# Patient Record
Sex: Female | Born: 2005 | Race: White | Hispanic: No | Marital: Single | Smoking: Never smoker
Health system: Southern US, Community
[De-identification: ages and names within clinical notes are randomized; demographics above are authoritative.]

## PROBLEM LIST (undated history)

## (undated) DIAGNOSIS — F419 Anxiety disorder, unspecified: Secondary | ICD-10-CM

## (undated) DIAGNOSIS — M1990 Unspecified osteoarthritis, unspecified site: Secondary | ICD-10-CM

## (undated) DIAGNOSIS — J45909 Unspecified asthma, uncomplicated: Secondary | ICD-10-CM

## (undated) DIAGNOSIS — G259 Extrapyramidal and movement disorder, unspecified: Secondary | ICD-10-CM

## (undated) DIAGNOSIS — R51 Headache: Secondary | ICD-10-CM

## (undated) DIAGNOSIS — R519 Headache, unspecified: Secondary | ICD-10-CM

## (undated) DIAGNOSIS — Q211 Atrial septal defect: Secondary | ICD-10-CM

## (undated) HISTORY — DX: Anxiety disorder, unspecified: F41.9

## (undated) HISTORY — DX: Atrial septal defect: Q21.1

## (undated) HISTORY — PX: TYMPANOSTOMY TUBE PLACEMENT: SHX32

## (undated) HISTORY — DX: Headache: R51

## (undated) HISTORY — DX: Unspecified asthma, uncomplicated: J45.909

## (undated) HISTORY — DX: Extrapyramidal and movement disorder, unspecified: G25.9

## (undated) HISTORY — PX: CARDIAC SURGERY: SHX584

## (undated) HISTORY — DX: Unspecified osteoarthritis, unspecified site: M19.90

## (undated) HISTORY — PX: CLEFT PALATE REPAIR: SUR1165

## (undated) HISTORY — DX: Headache, unspecified: R51.9

---
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
## 2023-01-06 ENCOUNTER — Other Ambulatory Visit (INDEPENDENT_AMBULATORY_CARE_PROVIDER_SITE_OTHER): Payer: Self-pay

## 2023-01-06 DIAGNOSIS — R259 Unspecified abnormal involuntary movements: Secondary | ICD-10-CM

## 2023-01-06 MED ORDER — TRIHEXYPHENIDYL HCL 2 MG PO TABS: ORAL_TABLET | ORAL | 1 refills | Status: DC

## 2023-01-06 MED ORDER — CARBIDOPA-LEVODOPA 25-100 MG PO TABS: ORAL_TABLET | ORAL | 1 refills | Status: DC

## 2023-01-06 NOTE — Telephone Encounter (Signed)
Last OV: 03-20-2022  Last Rx(s): 03-20-2022  Next appointment: None scheduled. Last appointment (Cancelled).  B. Roten CMA

## 2023-02-23 ENCOUNTER — Other Ambulatory Visit (INDEPENDENT_AMBULATORY_CARE_PROVIDER_SITE_OTHER): Payer: Self-pay | Admitting: Neurology

## 2023-02-24 NOTE — Telephone Encounter (Signed)
Last OV: 03-20-2022  Next Appointment: Not scheduled, Last was No Show.  Last refill: 08-30-2022  Note: On previous/most recent visit, plan incuded to possible d/c this medication.  B. Roten CMA

## 2023-03-03 ENCOUNTER — Other Ambulatory Visit (INDEPENDENT_AMBULATORY_CARE_PROVIDER_SITE_OTHER): Payer: Self-pay | Admitting: Neurology

## 2023-03-03 DIAGNOSIS — R259 Unspecified abnormal involuntary movements: Secondary | ICD-10-CM

## 2023-03-03 NOTE — Telephone Encounter (Signed)
Last OV 03/20/22 10/21/22 OV cx Sinemet last rx 01/06/23 with 1 rf  Artane last rx 01/06/23 with 1 rf Message to front to contact family to sched appt.  Refilled x 1 month with no further refills until after appt

## 2023-03-18 DIAGNOSIS — N921 Excessive and frequent menstruation with irregular cycle: Secondary | ICD-10-CM | POA: Insufficient documentation

## 2023-03-31 ENCOUNTER — Other Ambulatory Visit (INDEPENDENT_AMBULATORY_CARE_PROVIDER_SITE_OTHER): Payer: Self-pay | Admitting: Neurology

## 2023-03-31 DIAGNOSIS — R259 Unspecified abnormal involuntary movements: Secondary | ICD-10-CM

## 2023-04-01 NOTE — Telephone Encounter (Signed)
Last OV: 03-20-2022  Next OV: *Upcoming (04-09-2023)  Last Rx(s):  Artane: 03-03-2023 Sinemet: 03-03-2023 Flexeril: 02-25-2023  B. Roten CMA

## 2023-04-09 ENCOUNTER — Ambulatory Visit (INDEPENDENT_AMBULATORY_CARE_PROVIDER_SITE_OTHER): Payer: Self-pay | Admitting: Neurology

## 2023-04-09 NOTE — Progress Notes (Deleted)
Patient: Shelby Page MRN: [id number] Sex: female DOB: 2006-10-05  Provider: Keturah Shavers, MD Location of Care: [facility]  Note type: {CN NOTE [id number]}  Referral Source: Sherrie Mustache., MD   History from: {CN REFERRED AO:[id number]} Chief Complaint: Follow up Migraines, Dystonic Movements, ADHD  History of Present Illness:  Shelby Page is a 17 y.o. female ***.  Review of Systems: Review of system as per HPI, otherwise negative.  Past Medical History:  Diagnosis Date   ASD (atrial septal defect) 2010   Asthma    Headache    Movement disorder    Hospitalizations: {yes no:314532}, Head Injury: {yes no:314532}, Nervous System Infections: {yes no:314532}, Immunizations up to date: {yes no:314532}  Birth History ***  Surgical History Past Surgical History:  Procedure Laterality Date   CARDIAC SURGERY  at age 5, 61   CLEFT PALATE REPAIR  at age 55   TYMPANOSTOMY TUBE PLACEMENT      Family History family history includes ADD / ADHD in her sister; Allergic rhinitis in her sister; Asthma in her father and sister; Seizures in her sister. Family History is negative for ***.  Social History Social History   Socioeconomic History   Marital status: Single    Spouse name: Not on file   Number of children: Not on file   Years of education: Not on file   Highest education level: Not on file  Occupational History   Not on file  Tobacco Use   Smoking status: Never    Passive exposure: Never   Smokeless tobacco: Never  Vaping Use   Vaping Use: Never used  Substance and Sexual Activity   Alcohol use: No    Alcohol/week: 0.0 standard drinks of alcohol   Drug use: No   Sexual activity: Never  Other Topics Concern   Not on file  Social History Narrative   Shelby Page is a 8th grader on line classes. She does well in school.   Lives with her mom and sister.   Social Determinants of Health   Financial Resource Strain: Not on file  Food  Insecurity: Not on file  Transportation Needs: Not on file  Physical Activity: Not on file  Stress: Not on file  Social Connections: Not on file     Allergies  Allergen Reactions   Molds & Smuts Other (See Comments)    unknown   Methylpyrrolidone Nausea Only   Milk (Cow) Nausea And Vomiting   Omnicef [Cefdinir] Other (See Comments) and Nausea Only    unkwown    Physical Exam There were no vitals taken for this visit. ***  Assessment and Plan ***  No orders of the defined types were placed in this encounter.  No orders of the defined types were placed in this encounter.

## 2023-04-20 ENCOUNTER — Other Ambulatory Visit (INDEPENDENT_AMBULATORY_CARE_PROVIDER_SITE_OTHER): Payer: Self-pay | Admitting: Neurology

## 2023-04-29 ENCOUNTER — Other Ambulatory Visit (INDEPENDENT_AMBULATORY_CARE_PROVIDER_SITE_OTHER): Payer: Self-pay | Admitting: Neurology

## 2023-04-29 DIAGNOSIS — R259 Unspecified abnormal involuntary movements: Secondary | ICD-10-CM

## 2023-04-30 ENCOUNTER — Encounter (INDEPENDENT_AMBULATORY_CARE_PROVIDER_SITE_OTHER): Payer: Self-pay

## 2023-05-26 ENCOUNTER — Encounter: Payer: Self-pay | Admitting: Pediatrics

## 2023-06-24 ENCOUNTER — Other Ambulatory Visit (INDEPENDENT_AMBULATORY_CARE_PROVIDER_SITE_OTHER): Payer: Self-pay | Admitting: Neurology

## 2023-06-24 DIAGNOSIS — R259 Unspecified abnormal involuntary movements: Secondary | ICD-10-CM

## 2023-06-24 NOTE — Telephone Encounter (Signed)
Last OV 03/20/2022 Cx 10/21/22 and 04/09/23 no further appt sched. Medications refused until appt is scheduled.  Both Artane and Sinemet were ordered 04/29/23  with 1 refill and both were dispensed 05/27/23 Requested front office call to sched appt

## 2023-06-26 ENCOUNTER — Other Ambulatory Visit (INDEPENDENT_AMBULATORY_CARE_PROVIDER_SITE_OTHER): Payer: Self-pay | Admitting: Neurology

## 2023-07-14 ENCOUNTER — Other Ambulatory Visit (INDEPENDENT_AMBULATORY_CARE_PROVIDER_SITE_OTHER): Payer: Self-pay | Admitting: Neurology

## 2023-07-14 DIAGNOSIS — R259 Unspecified abnormal involuntary movements: Secondary | ICD-10-CM

## 2023-07-14 NOTE — Telephone Encounter (Signed)
  Name of who is calling: [name]    Caller's Relationship to Patient: mom  Best contact number: [phone]  Provider they see: Nab   Reason for call: Rx refills - pt will be out of medications on 9/13 per mom     PRESCRIPTION REFILL ONLY  Name of prescription: Gabapentin 100mg , Flexeril 5mg , Artane 2mg , Sinemet IR 25-100mg   Pharmacy: [facility] - [address] [place] - [phone]

## 2023-07-15 MED ORDER — CARBIDOPA-LEVODOPA 25-100 MG PO TABS
ORAL_TABLET | ORAL | 0 refills | Status: DC
Start: 2023-07-15 — End: 2023-09-25

## 2023-07-15 MED ORDER — CYCLOBENZAPRINE HCL 5 MG PO TABS: ORAL_TABLET | ORAL | 1 refills | Status: DC

## 2023-07-15 MED ORDER — TRIHEXYPHENIDYL HCL 2 MG PO TABS
ORAL_TABLET | ORAL | 1 refills | Status: DC
Start: 2023-07-15 — End: 2023-09-25

## 2023-07-15 MED ORDER — GABAPENTIN 100 MG PO CAPS: ORAL_CAPSULE | ORAL | 1 refills | Status: DC

## 2023-07-15 NOTE — Telephone Encounter (Signed)
  Last OV 03/20/2022 Cx 10/21/22, 04/09/2023  Next visit 09/25/2023  Requesting Flexeril 04/01/2023  0 RF Gabapentin 01/01/2023 6 rf Artane 04/29/23 1 refill Sinemet 04/29/2023 1 refill

## 2023-07-15 NOTE — Telephone Encounter (Signed)
Advised if appt is not kept in Nov she will not receive any further refills from Dr. Devonne Doughty. States understanding

## 2023-08-10 ENCOUNTER — Other Ambulatory Visit (INDEPENDENT_AMBULATORY_CARE_PROVIDER_SITE_OTHER): Payer: Self-pay | Admitting: Neurology

## 2023-08-10 DIAGNOSIS — R259 Unspecified abnormal involuntary movements: Secondary | ICD-10-CM

## 2023-09-06 ENCOUNTER — Other Ambulatory Visit (INDEPENDENT_AMBULATORY_CARE_PROVIDER_SITE_OTHER): Payer: Self-pay | Admitting: Neurology

## 2023-09-06 DIAGNOSIS — R259 Unspecified abnormal involuntary movements: Secondary | ICD-10-CM

## 2023-09-09 ENCOUNTER — Other Ambulatory Visit (INDEPENDENT_AMBULATORY_CARE_PROVIDER_SITE_OTHER): Payer: Self-pay | Admitting: Neurology

## 2023-09-09 DIAGNOSIS — R259 Unspecified abnormal involuntary movements: Secondary | ICD-10-CM

## 2023-09-09 NOTE — Telephone Encounter (Signed)
Last OV 03/20/2022 Next OV 09/25/2023 Artane 07/15/2023 with 1 refill Gabapentin 07/15/2023 with 1 rf

## 2023-09-25 ENCOUNTER — Ambulatory Visit (INDEPENDENT_AMBULATORY_CARE_PROVIDER_SITE_OTHER): Payer: MEDICAID | Admitting: Neurology

## 2023-09-25 ENCOUNTER — Encounter (INDEPENDENT_AMBULATORY_CARE_PROVIDER_SITE_OTHER): Payer: Self-pay | Admitting: Neurology

## 2023-09-25 ENCOUNTER — Telehealth (INDEPENDENT_AMBULATORY_CARE_PROVIDER_SITE_OTHER): Payer: Self-pay | Admitting: Neurology

## 2023-09-25 ENCOUNTER — Telehealth (INDEPENDENT_AMBULATORY_CARE_PROVIDER_SITE_OTHER): Payer: Self-pay

## 2023-09-25 VITALS — BP 112/66 | HR 64 | Ht <= 58 in | Wt 87.3 lb

## 2023-09-25 DIAGNOSIS — G249 Dystonia, unspecified: Secondary | ICD-10-CM | POA: Diagnosis not present

## 2023-09-25 DIAGNOSIS — G43009 Migraine without aura, not intractable, without status migrainosus: Secondary | ICD-10-CM | POA: Diagnosis not present

## 2023-09-25 DIAGNOSIS — R259 Unspecified abnormal involuntary movements: Secondary | ICD-10-CM

## 2023-09-25 DIAGNOSIS — F411 Generalized anxiety disorder: Secondary | ICD-10-CM

## 2023-09-25 DIAGNOSIS — F902 Attention-deficit hyperactivity disorder, combined type: Secondary | ICD-10-CM

## 2023-09-25 DIAGNOSIS — M79605 Pain in left leg: Secondary | ICD-10-CM

## 2023-09-25 DIAGNOSIS — M79604 Pain in right leg: Secondary | ICD-10-CM

## 2023-09-25 MED ORDER — TRIHEXYPHENIDYL HCL 2 MG PO TABS
ORAL_TABLET | ORAL | 1 refills | Status: DC
Start: 2023-09-25 — End: 2023-11-19

## 2023-09-25 MED ORDER — ONDANSETRON 4 MG PO TBDP: ORAL_TABLET | ORAL | 5 refills | Status: AC

## 2023-09-25 MED ORDER — CARBIDOPA-LEVODOPA 25-100 MG PO TABS
ORAL_TABLET | ORAL | 7 refills | Status: DC
Start: 2023-09-25 — End: 2023-12-01

## 2023-09-25 MED ORDER — GABAPENTIN 100 MG PO CAPS
ORAL_CAPSULE | ORAL | 1 refills | Status: DC
Start: 2023-09-25 — End: 2023-11-19

## 2023-09-25 MED ORDER — CLONAZEPAM 0.25 MG PO TBDP: ORAL_TABLET | ORAL | 3 refills | Status: DC

## 2023-09-25 NOTE — Telephone Encounter (Signed)
Cover My Meds [place] Records

## 2023-09-25 NOTE — Progress Notes (Signed)
Patient: Shelby Page MRN: [id number] Sex: female DOB: Mar 17, 2006  Provider: Keturah Shavers, MD Location of Care: [facility]  Note type: Routine return visit  Referral Source: PCP History from: patient, [facility] chart, and MOM Chief Complaint: Migraine without aura and without status migrainosus, not intractable   History of Present Illness: Shelby Page is a 17 y.o. female is here for follow-up management of headache, leg pain and muscle spasms. 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 She has been seen over the past few years and tried to adjust and decrease the dose of medication as much as possible but she was having more frequent symptoms particularly dystonic movements of the right hand and overall shaking episodes that would happen off and on so we had to continue some of these medications with low-dose to control these symptoms. Since her last visit in May 2023, she has been doing fairly well and has been taking her medications regularly but at some point she was out of Artane and she was having more frequent shaking of extremities. She usually sleeps well without any difficulty and with no awakening when she is taking her medication regularly and currently she is not having any significant pain as she had in the past including headache and body pain, leg pain and back pain.   Review of Systems: Review of system as per HPI, otherwise negative.  Past Medical History:  Diagnosis Date   ASD (atrial septal defect) 2010   Asthma    Headache    Movement disorder    Hospitalizations: No., Head Injury: No., Nervous System Infections: No., Immunizations up to date: Yes.     Surgical History Past Surgical  History:  Procedure Laterality Date   CARDIAC SURGERY  at age 52, 46   CLEFT PALATE REPAIR  at age 30   TYMPANOSTOMY TUBE PLACEMENT      Family History family history includes ADD / ADHD in her sister; Allergic rhinitis in her sister; Asthma in her father and sister; Seizures in her sister.   Social History Social History   Socioeconomic History   Marital status: Single    Spouse name: Not on file   Number of children: Not on file   Years of education: Not on file   Highest education level: Not on file  Occupational History   Not on file  Tobacco Use   Smoking status: Never    Passive exposure: Never   Smokeless tobacco: Never  Vaping Use   Vaping status: Never Used  Substance and Sexual Activity   Alcohol use: No    Alcohol/week: 0.0 standard drinks of alcohol   Drug use: No   Sexual activity: Never  Other Topics Concern   Not on file  Social History Narrative      Lives with her mom and sister.   Social Determinants of Health   Financial Resource Strain: Low Risk  (12/13/2022)   Received from [facility], [facility]   Overall Financial Resource Strain (CARDIA)    Difficulty of Paying Living Expenses: Not very hard  Food Insecurity: No Food Insecurity (12/13/2022)   Received from [facility], [facility]   Hunger Vital Sign    Worried About Running Out of Food in the Last Year: Never true    Ran Out  of Food in the Last Year: Never true  Transportation Needs: No Transportation Needs (12/13/2022)   Received from [facility], [facility]   [facility] - Transportation    Lack of Transportation (Medical): No    Lack of Transportation (Non-Medical): No  Physical Activity: Not on file  Stress: Not on file  Social Connections: Unknown (03/12/2022)   Received from [facility], [facility]   Social Network    Social Network: Not on file     Allergies  Allergen Reactions   Molds & Smuts Other (See Comments)    unknown   Methylpyrrolidone Nausea Only    Milk (Cow) Nausea And Vomiting   Omnicef [Cefdinir] Other (See Comments) and Nausea Only    unkwown    Physical Exam BP 112/66   Pulse 64   Ht 4' 9.64" (1.464 m)   Wt 87 lb 4.8 oz (39.6 kg)   BMI 18.48 kg/m  Gen: Awake, alert, not in distress, Non-toxic appearance. Skin: No neurocutaneous stigmata, no rash HEENT: Normocephalic, no dysmorphic features, no conjunctival injection, nares patent, mucous membranes moist, oropharynx clear. Neck: Supple, no meningismus, no lymphadenopathy,  Resp: Clear to auscultation bilaterally CV: Regular rate, normal S1/S2, no murmurs, no rubs Abd: Bowel sounds present, abdomen soft, non-tender, non-distended.  No hepatosplenomegaly or mass. Ext: Warm and well-perfused. No deformity, no muscle wasting, ROM full.  Neurological Examination: MS- Awake, alert, interactive Cranial Nerves- Pupils equal, round and reactive to light (5 to 3mm); fix and follows with full and smooth EOM; no nystagmus; no ptosis, funduscopy with normal sharp discs, visual field full by looking at the toys on the side, face symmetric with smile.  Hearing intact to bell bilaterally, palate elevation is symmetric, and tongue protrusion is symmetric. Tone- Normal Strength-Seems to have good strength, symmetrically by observation and passive movement. Reflexes-    Biceps Triceps Brachioradialis Patellar Ankle  R 2+ 2+ 2+ 2+ 2+  L 2+ 2+ 2+ 2+ 2+   Plantar responses flexor bilaterally, no clonus noted Sensation- Withdraw at four limbs to stimuli. Coordination- Reached to the object with no dysmetria Gait: Normal walk without any coordination or balance issues.   Assessment and Plan 1. Migraine without aura and without status migrainosus, not intractable   2. Dystonic movements   3. Anxiety state   4. Attention deficit hyperactivity disorder (ADHD), combined type   5. Leg pain, bilateral   6. Abnormal involuntary movements    This is a 17 year old female with multiple medical  issues as mentioned in HPI, on multiple different medications which she needed to continue and every time that we would decrease the dose of medications, she would have more different symptoms and we had to go back up on the medication.  She has no new findings on her neurological examination. Recommend to continue the same dose of medications as before but I would slightly decrease the dose of clonazepam to 0.25 mg in case she needed for anxiety and panic attacks but I will continue other medications at the same dose for now. I discussed with mother again regarding transferring care of patient and her sister both to adult neurology at some point over the next year but for now I will schedule a follow-up appointment in about 7 months in case if they would not be able to transfer the care but at any time if they do then I will send all the notes to the new neurologist to continue care.  Mother understood and agreed with the plan.  Meds ordered this encounter  Medications   trihexyphenidyl (ARTANE) 2 MG tablet    Sig: TAKE 1/2 TABLET BY MOUTH EVERY NIGHT    Dispense:  15 tablet    Refill:  1    No further refills will be given if patient does not keep her Nov appt   ondansetron (ZOFRAN-ODT) 4 MG disintegrating tablet    Sig: DISSOLVE 1 TABLET IN MOUTH AT ONSET OF MIGRAINE. MAY REPEAT IN 8 HOURS    Dispense:  20 tablet    Refill:  5   gabapentin (NEURONTIN) 100 MG capsule    Sig: Take 1 capsule in the morning and 2 capsules at night    Dispense:  90 capsule    Refill:  1    Refills require OV   clonazePAM (KLONOPIN) 0.25 MG disintegrating tablet    Sig: Take 1 tablet daily as needed for anxiety or panic attack    Dispense:  30 tablet    Refill:  3   carbidopa-levodopa (SINEMET IR) 25-100 MG tablet    Sig: TAKE 1/2 TABLET BY MOUTH TWICE DAILY    Dispense:  30 tablet    Refill:  7    72 to last until 11/21 OV   No orders of the defined types were placed in this encounter.

## 2023-09-25 NOTE — Addendum Note (Signed)
Addended by: Vita Barley B on: 09/25/2023 05:19 PM   Modules accepted: Orders

## 2023-09-25 NOTE — Telephone Encounter (Signed)
  Name of who is calling: [name] Verbeek  Caller's Relationship to Patient: Mom  Best contact number: [phone]  Provider they see: Dr. Merri Brunette  Reason for call: Mom said medication [facility]ast[facility] [place] on [address]      PRESCRIPTION REFILL ONLY  Name of prescription:  Pharmacy:

## 2023-09-25 NOTE — Patient Instructions (Signed)
Continue carbidopa-levodopa at half a tablet twice daily Continue Flexeril at 5 mg tablet every day in case of muscle spasms Continue gabapentin 1 capsule in the morning and 2 capsules in the evening Continue Artane at 1 mg every night Continue Zofran as needed for nausea  Get a referral to see adult neurology But in case I will make a follow-up appointment in about 7 months

## 2023-09-26 ENCOUNTER — Other Ambulatory Visit: Payer: Self-pay | Admitting: Neurology

## 2023-09-29 NOTE — Telephone Encounter (Signed)
Requested provider change from ODT to tab which is the preferred form

## 2023-10-07 ENCOUNTER — Other Ambulatory Visit (INDEPENDENT_AMBULATORY_CARE_PROVIDER_SITE_OTHER): Payer: Self-pay | Admitting: Neurology

## 2023-10-07 DIAGNOSIS — R259 Unspecified abnormal involuntary movements: Secondary | ICD-10-CM

## 2023-11-15 ENCOUNTER — Other Ambulatory Visit (INDEPENDENT_AMBULATORY_CARE_PROVIDER_SITE_OTHER): Payer: Self-pay | Admitting: Neurology

## 2023-11-15 DIAGNOSIS — R259 Unspecified abnormal involuntary movements: Secondary | ICD-10-CM

## 2023-11-19 ENCOUNTER — Other Ambulatory Visit (INDEPENDENT_AMBULATORY_CARE_PROVIDER_SITE_OTHER): Payer: Self-pay

## 2023-11-19 ENCOUNTER — Telehealth (INDEPENDENT_AMBULATORY_CARE_PROVIDER_SITE_OTHER): Payer: Self-pay | Admitting: Neurology

## 2023-11-19 DIAGNOSIS — R259 Unspecified abnormal involuntary movements: Secondary | ICD-10-CM

## 2023-11-19 MED ORDER — GABAPENTIN 100 MG PO CAPS: ORAL_CAPSULE | ORAL | 2 refills | Status: DC

## 2023-11-19 MED ORDER — TRIHEXYPHENIDYL HCL 2 MG PO TABS: ORAL_TABLET | ORAL | 2 refills | Status: DC

## 2023-11-19 NOTE — Telephone Encounter (Signed)
 Who's calling (name and relationship to patient) : Shelby Page; mom   Best contact number: [phone]  Provider they see: Dr. Blanchie Bunkers   Reason for call: Mom called stating that the adult neurologist won't be able to fill [facility] Rx until she is seen. Her appt is schedule on March 25. Mom is requesting a call back.    Call ID:      PRESCRIPTION REFILL ONLY  Name of prescription:  Pharmacy:

## 2023-11-19 NOTE — Telephone Encounter (Signed)
 Called mom about message that was left about the medications that Dr. Blanchie Bunkers fills. She was stating that the adult neurologist can't refill medication until they are seen. I told her that was true and I will put in a refill for those medications until the appointment which is in March with the adult neurologist. They will take over care and prescriptions after new patient appointment happens.  I put in refills for the patient . Mom understood message

## 2023-11-30 ENCOUNTER — Other Ambulatory Visit (INDEPENDENT_AMBULATORY_CARE_PROVIDER_SITE_OTHER): Payer: Self-pay | Admitting: Neurology

## 2023-11-30 DIAGNOSIS — R259 Unspecified abnormal involuntary movements: Secondary | ICD-10-CM

## 2023-12-01 NOTE — Telephone Encounter (Signed)
Had previously sent over medication refill to [facility] , it was the wrong pharmacy. Sent medication to [facility] so they can have enough to last till March when they see the Adult neurologist.

## 2023-12-13 ENCOUNTER — Other Ambulatory Visit (INDEPENDENT_AMBULATORY_CARE_PROVIDER_SITE_OTHER): Payer: Self-pay | Admitting: Neurology

## 2023-12-13 DIAGNOSIS — R259 Unspecified abnormal involuntary movements: Secondary | ICD-10-CM

## 2023-12-23 ENCOUNTER — Ambulatory Visit: Payer: MEDICAID | Admitting: Internal Medicine

## 2024-01-10 ENCOUNTER — Other Ambulatory Visit (INDEPENDENT_AMBULATORY_CARE_PROVIDER_SITE_OTHER): Payer: Self-pay | Admitting: Neurology

## 2024-01-10 DIAGNOSIS — R259 Unspecified abnormal involuntary movements: Secondary | ICD-10-CM

## 2024-01-15 ENCOUNTER — Ambulatory Visit: Payer: MEDICAID | Admitting: Internal Medicine

## 2024-01-25 ENCOUNTER — Other Ambulatory Visit (INDEPENDENT_AMBULATORY_CARE_PROVIDER_SITE_OTHER): Payer: Self-pay | Admitting: Neurology

## 2024-01-25 DIAGNOSIS — R259 Unspecified abnormal involuntary movements: Secondary | ICD-10-CM

## 2024-02-08 ENCOUNTER — Other Ambulatory Visit (INDEPENDENT_AMBULATORY_CARE_PROVIDER_SITE_OTHER): Payer: Self-pay | Admitting: Neurology

## 2024-02-08 DIAGNOSIS — R259 Unspecified abnormal involuntary movements: Secondary | ICD-10-CM

## 2024-02-11 ENCOUNTER — Ambulatory Visit: Payer: MEDICAID | Admitting: Internal Medicine

## 2024-02-22 ENCOUNTER — Other Ambulatory Visit (INDEPENDENT_AMBULATORY_CARE_PROVIDER_SITE_OTHER): Payer: Self-pay | Admitting: Neurology

## 2024-02-22 DIAGNOSIS — R259 Unspecified abnormal involuntary movements: Secondary | ICD-10-CM

## 2024-02-23 ENCOUNTER — Other Ambulatory Visit (INDEPENDENT_AMBULATORY_CARE_PROVIDER_SITE_OTHER): Payer: Self-pay | Admitting: Neurology

## 2024-02-23 DIAGNOSIS — R259 Unspecified abnormal involuntary movements: Secondary | ICD-10-CM

## 2024-03-08 ENCOUNTER — Ambulatory Visit: Payer: MEDICAID | Admitting: Internal Medicine

## 2024-03-14 ENCOUNTER — Other Ambulatory Visit (INDEPENDENT_AMBULATORY_CARE_PROVIDER_SITE_OTHER): Payer: Self-pay | Admitting: Neurology

## 2024-03-14 DIAGNOSIS — R259 Unspecified abnormal involuntary movements: Secondary | ICD-10-CM

## 2024-04-12 ENCOUNTER — Other Ambulatory Visit (INDEPENDENT_AMBULATORY_CARE_PROVIDER_SITE_OTHER): Payer: Self-pay | Admitting: Neurology

## 2024-04-12 DIAGNOSIS — R259 Unspecified abnormal involuntary movements: Secondary | ICD-10-CM

## 2024-04-28 ENCOUNTER — Other Ambulatory Visit (INDEPENDENT_AMBULATORY_CARE_PROVIDER_SITE_OTHER): Payer: Self-pay | Admitting: Neurology

## 2024-04-28 DIAGNOSIS — R259 Unspecified abnormal involuntary movements: Secondary | ICD-10-CM

## 2024-05-10 ENCOUNTER — Telehealth: Payer: Self-pay

## 2024-05-10 ENCOUNTER — Encounter: Payer: Self-pay | Admitting: Internal Medicine

## 2024-05-10 ENCOUNTER — Ambulatory Visit (INDEPENDENT_AMBULATORY_CARE_PROVIDER_SITE_OTHER): Payer: MEDICAID | Admitting: Internal Medicine

## 2024-05-10 VITALS — BP 106/64 | HR 98 | Temp 98.1°F | Resp 20 | Ht 58.5 in | Wt 91.0 lb

## 2024-05-10 DIAGNOSIS — R053 Chronic cough: Secondary | ICD-10-CM

## 2024-05-10 DIAGNOSIS — Q211 Atrial septal defect, unspecified: Secondary | ICD-10-CM

## 2024-05-10 DIAGNOSIS — Z8669 Personal history of other diseases of the nervous system and sense organs: Secondary | ICD-10-CM

## 2024-05-10 DIAGNOSIS — J4520 Mild intermittent asthma, uncomplicated: Secondary | ICD-10-CM | POA: Diagnosis not present

## 2024-05-10 DIAGNOSIS — J3089 Other allergic rhinitis: Secondary | ICD-10-CM

## 2024-05-10 DIAGNOSIS — K219 Gastro-esophageal reflux disease without esophagitis: Secondary | ICD-10-CM

## 2024-05-10 DIAGNOSIS — Q256 Stenosis of pulmonary artery: Secondary | ICD-10-CM

## 2024-05-10 DIAGNOSIS — Q8719 Other congenital malformation syndromes predominantly associated with short stature: Secondary | ICD-10-CM

## 2024-05-10 MED ORDER — CARBINOXAMINE MALEATE 4 MG PO TABS: 4.0000 mg | ORAL_TABLET | Freq: Three times a day (TID) | ORAL | 3 refills | Status: AC

## 2024-05-10 MED ORDER — FAMOTIDINE 20 MG PO TABS: 20.0000 mg | ORAL_TABLET | Freq: Two times a day (BID) | ORAL | 3 refills | Status: DC

## 2024-05-10 MED ORDER — IPRATROPIUM BROMIDE 0.06 % NA SOLN: 2.0000 | Freq: Four times a day (QID) | NASAL | 12 refills | Status: AC

## 2024-05-10 NOTE — Telephone Encounter (Signed)
*  AA  Pharmacy Patient Advocate Encounter   Received notification from CoverMyMeds that prior authorization for Carbinoxamine  Maleate 4MG  tablets  is required/requested.   Insurance verification completed.   The patient is insured through [employer] [facility] .   Per test claim: PA required; PA submitted to above mentioned insurance via CoverMyMeds Key/confirmation #/[id number] [facility] Status is pending

## 2024-05-10 NOTE — Progress Notes (Signed)
 NEW PATIENT Date of Service/Encounter:  05/10/24 Referring provider: Dyane Faden, MD Primary care provider: Dyane Faden, MD  Subjective:  Shelby Page is a 18 y.o. female  presenting today for evaluation of asthma, rhinitis  History obtained from: chart review and patient, mother, and sister.   Discussed the use of AI scribe software for clinical note transcription with the patient, who gave verbal consent to proceed.  History of Present Illness Shelby Page is a 18 year old female who presents with a persistent cough post-COVID. She is accompanied by her mother, Clayborne, and her aunt, Madelin. She was referred by Dr. Dyane for further evaluation by an allergy specialist.  Chronic cough - Persistent cough for approximately nine months following COVID-19 infection - Cough has not resolved despite multiple treatments including albuterol  HFA, nebulized and BID nebulized pulmicort .  - Cough does not worsen at night and does not wake her from sleep - No coughing during sleep - Snoring present, unchanged from baseline  Therapeutic interventions for cough - Albuterol  (Ventolin ) inhaler: two to four puffs every four to six hours as needed, not effective - Pulmicort : used every four hours during acute COVID-19 illness, then once a week to once a month post-COVID during coughing flares, not effective - Robitussin, elderberry cough syrup, and homemade remedies (honey and whiskey) tried without resolution of cough  Upper airway symptoms - Sensation of mucus in throat - Postnasal drip and drainage down the back of throat - No bad taste in mouth upon waking - Currently taking loratadine , Flonase , and Singulair  daily  Gastroesophageal reflux symptoms - History of acid reflux since birth - Reflux symptoms worsen when lying flat for prolonged periods - No heartburn symptoms  Relevant medical history - Premature birth at [redacted] weeks gestation - History of cleft palate with speech  repair    Chart Review:  2019: FVC 2.27, FEV1 2.06. Predicted FVC 2.34, predicted FEV1 2.14.   Other allergy screening: Asthma: yes Rhino conjunctivitis: yes Food allergy: no Medication allergy: no Hymenoptera allergy: no Urticaria: no Eczema:no History of recurrent infections suggestive of immunodeficency: no Vaccinations are up to date.  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 2 Inhaler 1   albuterol  (PROVENTIL ) (2.5 MG/3ML) 0.083% nebulizer solution Take 3 mLs (2.5 mg total) by nebulization every 4 (four) hours as needed for wheezing or shortness of breath. 75 mL 1   budesonide  (PULMICORT ) 0.5 MG/2ML nebulizer solution Use one unit dose once a day to prevent cough or wheeze 15 mL 5   Carbinoxamine  Maleate 4 MG TABS Take 1 tablet (4 mg total) by mouth in the morning, at noon, and at bedtime. 90 tablet 3   cyclobenzaprine  (FLEXERIL ) 5 MG tablet TAKE 1 TABLET BY MOUTH TWICE DAILY AS NEEDED FOR MUSCLE SPASMS 30 tablet 0   famotidine  (PEPCID ) 20 MG tablet Take 1 tablet (20 mg total) by  mouth 2 (two) times daily. 60 tablet 3   gabapentin  (NEURONTIN ) 100 MG capsule TAKE 1 CAPSULE BY MOUTH EVERY MORNING AND TAKE 2 CAPSULES BY MOUTH AT NIGHT 90 capsule 0   ipratropium (ATROVENT ) 0.06 % nasal spray Place 2 sprays into both nostrils 4 (four) times daily. 15 mL 12   loratadine  (CLARITIN )  10 MG tablet TAKE 1 TABLET (10 MG TOTAL) BY MOUTH DAILY AS NEEDED (FOR RUNNY NOSE). 30 tablet 0   montelukast  (SINGULAIR ) 5 MG chewable tablet CHEW 1 TABLET (5 MG TOTAL) BY MOUTH AT BEDTIME. 30 tablet 0   trihexyphenidyl  (ARTANE ) 2 MG tablet TAKE 1/2 TABLET BY MOUTH NIGHTLY 30 tablet 2   albuterol  (ACCUNEB ) 0.63 MG/3ML nebulizer solution      carbidopa -levodopa  (SINEMET  IR) 25-100 MG tablet TAKE 1/2 TABLET BY MOUTH TWICE DAILY 240 tablet 2   diazepam (DIASTAT ACUDIAL) 10 MG GEL Place 7.5 mg rectally as needed. (Patient not taking: Reported on 03/20/2022)     fluticasone  (FLONASE ) 50 MCG/ACT nasal spray Place 1 spray into both nostrils 2 (two) times daily as needed (for stuffy nose). 16 g 5   ibuprofen (ADVIL,MOTRIN) 100 MG/5ML suspension Take by mouth.     medroxyPROGESTERone (DEPO-PROVERA) 150 MG/ML injection Inject 150 mg into the muscle every 3 (three) months.     Methylphenidate HCl ER, XR, 40 MG CP24 Take by mouth.      Multiple Vitamin (MULTIVITAMIN) capsule Take by mouth.     ondansetron  (ZOFRAN -ODT) 4 MG disintegrating tablet DISSOLVE 1 TABLET IN MOUTH AT ONSET OF MIGRAINE. MAY REPEAT IN 8 HOURS 20 tablet 5   promethazine (PHENERGAN) 12.5 MG tablet Take 12.5 mg by mouth.     triamcinolone cream (KENALOG) 0.1 % apply to affected area twice a day  0   No current facility-administered medications for this visit.   Known Allergies:  Allergies  Allergen Reactions   Molds & Smuts Other (See Comments)    unknown   Methylpyrrolidone Nausea Only   Milk (Cow) Nausea And Vomiting   Omnicef [Cefdinir] Other (See Comments) and Nausea Only    unkwown   Past Surgical History: Past Surgical History:  Procedure Laterality Date   CARDIAC SURGERY  at age 16, 11   CLEFT PALATE REPAIR  at age 34   TYMPANOSTOMY TUBE PLACEMENT     Family History: Family History  Problem Relation Age of Onset   Asthma Father    Asthma Sister    Allergic rhinitis Sister    Seizures Sister    ADD / ADHD Sister     Social History: [place] lives single-family home that is 20 years old.  No water damage in the house.  Carpet throughout.  Electric heating, heat pump.  Dog with access to bedroom.  Bed is 2 feet off the room.  Plastic covers on bed but not pillows.  No smoke exposure.  Helps out at home..   ROS:  All other systems negative except as noted per HPI.  Objective:  Blood pressure 106/64, pulse 98, temperature 98.1 F (36.7 C), temperature source Temporal, resp. rate 20, height 4' 10.5 (1.486 m), weight 91 lb (41.3 kg), SpO2 98%. Body mass index is 18.7 kg/m. Physical Exam:  General Appearance:  Alert, cooperative, no distress, appears stated age  Head:  Normocephalic, without obvious abnormality, atraumatic  Eyes:  Conjunctiva clear, EOM's intact  Ears EACs normal bilaterally and normal TMs bilaterally  Nose: Nares normal, pale edematous nasal mucosa, no visible anterior polyps, and septum midline  Throat: Lips, tongue normal; teeth and gums normal, + cobblestoning and mildly erythematous posterior oropharynx  Neck: Supple, symmetrical  Lungs:   clear to auscultation bilaterally, Respirations unlabored, intermittent barking cough   Heart:  2/6 systolic murmur  and regular rate and rhythm, Appears well perfused  Extremities: No edema  Skin: Skin color, texture, turgor normal and no rashes or lesions on visualized portions of skin  Neurologic: No gross deficits   Diagnostics: Spirometry:  Tracings reviewed. Her effort: Good reproducible efforts. FVC: 3.13L (pre), 2.83L (post)  FEV1: 2.67L, 97% predicted (pre),  2.24L, 86% (post)  FEV1/FVC ratio: 85 (pre), 78 (post)  Interpretation: Spirometry consistent with normal pattern.  No significant post bronchodilator respones  Please see scanned spirometry results for details.   Labs:  Lab Orders  No laboratory test(s) ordered today     Assessment and Plan  Assessment and Plan Assessment & Plan Chronic cough post-COVID Chronic  cough for nine months post-COVID, unresponsive to albuterol , Pulmicort , or cough syrups. Normal breathing test suggests non-asthma etiology.  - Baseline spirometry normal with no significant response to albuterol  - Will focus on other causes of chronic cough: Upper airway cough syndrome, GERD, neurogenic cough  - If no response may consider trial of higher dose ICS   Start:  -Pepcid  20mg  twice daily  -Atrovent  (ipatopium) nasal spray 1-2 sprays in each nostril up to three times daily AS NEEDED for POST NASAL DRIP/RUNNY NOSE/DRAINAGE.  If you become too dry, use less often. - Carbinoxamine  4mg  up to 3 times a day as needed  (this replaces loratadine )  - Continue flonase  1 spray per nostril twice daily   Follow up in 4-6 weeks     This note in its entirety was forwarded to the Provider who requested this consultation.  Other: reviewed spirometry technique and reviewed inhaler technique  Thank you for your kind referral. I appreciate the opportunity to take part in [facility] care. Please do not hesitate to contact me with questions.  Sincerely,  Thank you so much for letting me partake in your care today.  Don't hesitate to reach out if you have any additional concerns!  Hargis Springer, MD  [facility] [place], [place]

## 2024-05-10 NOTE — Addendum Note (Signed)
 Addended by: GRETEL KRABBE on: 05/10/2024 03:22 PM   Modules accepted: Orders

## 2024-05-10 NOTE — Patient Instructions (Signed)
 Chronic cough post-COVID Chronic cough for nine months post-COVID, unresponsive to albuterol , Pulmicort , or cough syrups. Normal breathing test suggests non-asthma etiology.  - Baseline spirometry normal with no significant response to albuterol  - Will focus on other causes of chronic cough: Upper airway cough syndrome, GERD, neurogenic cough  - If no response may consider trial of higher dose ICS   Start:  -Pepcid  20mg  twice daily  -Atrovent  (ipatopium) nasal spray 1-2 sprays in each nostril up to three times daily AS NEEDED for POST NASAL DRIP/RUNNY NOSE/DRAINAGE.  If you become too dry, use less often. - Carbinoxamine  4mg  up to 3 times a day as needed  (this replaces loratadine )  - Continue flonase  1 spray per nostril twice daily   Follow up in 4-6 weeks

## 2024-05-12 ENCOUNTER — Other Ambulatory Visit: Payer: Self-pay

## 2024-05-12 ENCOUNTER — Telehealth: Payer: Self-pay

## 2024-05-12 NOTE — Telephone Encounter (Signed)
 Patient mother called and stated that Shelby Page used Atrovent  at 11:00 am and at about 2 she was complaining of breathing complication. Spoke with Dr. Lorin and informed patient's mother to give antihistamine and stop Atrovent  and nasal spray should not cause patient not to be able to breath but can cause her to fell like she is drying out. Mother verbalized understanding with no further questions.

## 2024-05-12 NOTE — Telephone Encounter (Signed)
 We can change to carbinoxamine  liquid 4mg /2ml: take 4mg  three times a day as needed

## 2024-05-12 NOTE — Telephone Encounter (Signed)
 Rx sent to pharmacy

## 2024-05-30 ENCOUNTER — Other Ambulatory Visit (INDEPENDENT_AMBULATORY_CARE_PROVIDER_SITE_OTHER): Payer: Self-pay | Admitting: Neurology

## 2024-05-30 DIAGNOSIS — R259 Unspecified abnormal involuntary movements: Secondary | ICD-10-CM

## 2024-06-28 ENCOUNTER — Ambulatory Visit: Payer: MEDICAID | Admitting: Internal Medicine

## 2024-08-30 ENCOUNTER — Other Ambulatory Visit: Payer: Self-pay

## 2024-08-30 MED ORDER — FAMOTIDINE 20 MG PO TABS: 20.0000 mg | ORAL_TABLET | Freq: Two times a day (BID) | ORAL | 3 refills | Status: AC
